# Patient Record
Sex: Male | Born: 2000 | Race: Black or African American | Hispanic: No | Marital: Single | Smoking: Former smoker
Health system: Southern US, Community
[De-identification: ages and names within clinical notes are randomized; demographics above are authoritative.]

---
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
## 2019-08-04 ENCOUNTER — Ambulatory Visit (INDEPENDENT_AMBULATORY_CARE_PROVIDER_SITE_OTHER): Payer: Medicaid Other

## 2019-08-04 ENCOUNTER — Other Ambulatory Visit: Payer: Self-pay

## 2019-08-04 ENCOUNTER — Encounter (HOSPITAL_COMMUNITY): Payer: Self-pay

## 2019-08-04 ENCOUNTER — Ambulatory Visit (HOSPITAL_COMMUNITY)
Admission: EM | Admit: 2019-08-04 | Discharge: 2019-08-04 | Disposition: A | Discharge status: Home / Self Care | Payer: Medicaid Other | Attending: Family Medicine | Admitting: Family Medicine

## 2019-08-04 DIAGNOSIS — S82842A Displaced bimalleolar fracture of left lower leg, initial encounter for closed fracture: Secondary | ICD-10-CM

## 2019-08-04 DIAGNOSIS — S92125A Nondisplaced fracture of body of left talus, initial encounter for closed fracture: Secondary | ICD-10-CM

## 2019-08-04 DIAGNOSIS — M25572 Pain in left ankle and joints of left foot: Secondary | ICD-10-CM | POA: Diagnosis not present

## 2019-08-04 DIAGNOSIS — M79672 Pain in left foot: Secondary | ICD-10-CM

## 2019-08-04 MED ORDER — HYDROCODONE-ACETAMINOPHEN 5-325 MG PO TABS: 1.0000 | ORAL_TABLET | Freq: Four times a day (QID) | ORAL | 0 refills | Status: DC | PRN

## 2019-08-04 NOTE — ED Provider Notes (Signed)
[facility]   [id number] 08/04/19 Arrival Time: 1231  ASSESSMENT & PLAN:  1. Acute left ankle pain   2. Acute foot pain, left   3. Closed bimalleolar fracture of left ankle, initial encounter   4. Closed nondisplaced fracture of body of left talus, initial encounter     I have personally viewed the imaging studies ordered this visit. Bimalleolar fractures. Apparent talus fracture. Discussed.   Meds ordered this encounter  Medications  . HYDROcodone-acetaminophen (NORCO/VICODIN) 5-325 MG tablet    Sig: Take 1 tablet by mouth every 6 (six) hours as needed for moderate pain or severe pain.    Dispense:  15 tablet    Refill:  0    Orders Placed This Encounter  Procedures  . DG Ankle Complete Left  . DG Foot Complete Left  . Apply splint short leg  . Crutches    Recommend:  Follow-up Information    Haddix, Gillie Manners, MD.   Specialty: Orthopedic Surgery Why: Dr Jena Gauss will see you Monday or Tuesday. Just give his office a call to set up an appointment. Contact information: [address] [place] [address] [phone]           Rest the injured area as much as practical. [place] Controlled Substances Registry consulted for this patient. I feel the risk/benefit ratio today is favorable for proceeding with this prescription for a controlled substance. Medication sedation precautions given.  Reviewed expectations re: course of current medical issues. Questions answered. Outlined signs and symptoms indicating need for more acute intervention. Patient verbalized understanding. After Visit Summary given.   SUBJECTIVE: History from: patient. Ronnie Munoz is a 19 y.o. male who reports persistent marked pain of his left ankle/foot; described as aching/throbbing; without radiation. Onset: abrupt. First noted: yesterday. Injury/trama: reports falling down a few steps and twisting ankle/foot; immediate pain; reports inability to bear weight after and  currently secondary to pain. Symptoms have progressed to a point and plateaued since beginning. Aggravating factors: certain movements. Alleviating factors: elevating LLE. Associated symptoms: none reported. Extremity sensation changes or weakness: none. Self treatment: has not tried OTC therapies. History of similar: no.   OBJECTIVE:  Vitals:   08/04/19 1328 08/04/19 1330  BP:  114/85  Pulse:  91  Resp:  16  Temp:  98.5 F (36.9 C)  TempSrc:  Oral  Weight: 72.6 kg     General appearance: alert; no distress HEENT: [place]; AT Neck: supple with FROM Resp: unlabored respirations Extremities: . LLE: warm with well perfused appearance; poorly localized moderate tenderness over his entire ankle; without gross deformities; swelling: significant; bruising: none; ankle ROM: limited by reported pain CV: brisk extremity capillary refill of LLE; 2+ DP pulse of LLE. Skin: warm and dry; no visible rashes Neurologic: normal sensation of LLE Psychological: alert and cooperative; normal mood and affect  Imaging: DG Ankle Complete Left  Result Date: 08/04/2019 CLINICAL DATA:  Left ankle pain and swelling after a fall down stairs last night. Initial encounter. EXAM: LEFT ANKLE COMPLETE - 3+ VIEW COMPARISON:  None. FINDINGS: The patient has a nondisplaced fracture of the medial malleolus. There is also a nondisplaced and incomplete appearing fracture through the distal diaphysis of the fibula. Imaged bones otherwise appear normal. There is soft tissue swelling about the ankle. Linear calcification projecting over the talonavicular joint on the lateral view is likely incidental. IMPRESSION: Acute nondisplaced medial and lateral malleolar fractures with associated swelling. Electronically Signed   By: Drusilla Kanner M.D.  On: 08/04/2019 14:04   DG Foot Complete Left  Result Date: 08/04/2019 CLINICAL DATA:  Left foot and ankle pain due to an injury suffered in a fall down steps last night. Initial  encounter. EXAM: LEFT FOOT - COMPLETE 3+ VIEW COMPARISON:  None. FINDINGS: A tiny bone fragment seen off the distal head of the talus compatible with an avulsion fracture. There is soft tissue swelling about the foot. IMPRESSION: Findings worrisome for a tiny avulsion fracture off the dorsal head of the talus. Electronically Signed   By: Inge Rise M.D.   On: 08/04/2019 14:06      No Known Allergies   Social History   Socioeconomic History  . Marital status: Single    Spouse name: Not on file  . Number of children: Not on file  . Years of education: Not on file  . Highest education level: Not on file  Occupational History  . Not on file  Tobacco Use  . Smoking status: Current Every Day Smoker    Packs/day: 1.00    Types: Cigars  . Smokeless tobacco: Never Used  Substance and Sexual Activity  . Alcohol use: Yes  . Drug use: No  . Sexual activity: Never  Other Topics Concern  . Not on file  Social History Narrative  . Not on file   Social Determinants of Health   Financial Resource Strain:   . Difficulty of Paying Living Expenses: Not on file  Food Insecurity:   . Worried About Charity fundraiser in the Last Year: Not on file  . Ran Out of Food in the Last Year: Not on file  Transportation Needs:   . Lack of Transportation (Medical): Not on file  . Lack of Transportation (Non-Medical): Not on file  Physical Activity:   . Days of Exercise per Week: Not on file  . Minutes of Exercise per Session: Not on file  Stress:   . Feeling of Stress : Not on file  Social Connections:   . Frequency of Communication with Friends and Family: Not on file  . Frequency of Social Gatherings with Friends and Family: Not on file  . Attends Religious Services: Not on file  . Active Member of Clubs or Organizations: Not on file  . Attends Archivist Meetings: Not on file  . Marital Status: Not on file   Family History  Family history unknown: Yes   History reviewed.  No pertinent surgical history.    Vanessa Kick, MD 08/07/19 [phone]

## 2019-08-04 NOTE — ED Triage Notes (Signed)
Pt states  He fell down some steps and injured his left ankle. This happened last night.

## 2019-08-04 NOTE — Progress Notes (Signed)
Orthopedic Tech Progress Note Patient Details:  Ronnie Munoz September 10, 2000 [id number]  Ortho Devices Type of Ortho Device: Crutches, Stirrup splint, Post (short leg) splint Ortho Device/Splint Interventions: Application, Ordered   Post Interventions Patient Tolerated: Ambulated well, Well Instructions Provided: Poper ambulation with device, Care of device, Adjustment of device   Donald Pore 08/04/2019, 3:38 PM

## 2019-08-04 NOTE — Discharge Instructions (Signed)
Do not bear any weight on your left ankle/foot. Do not remove your splint.   Be aware, pain medications may cause drowsiness. Please do not drive, operate heavy machinery or make important decisions while on this medication, it can cloud your judgement.

## 2019-08-04 NOTE — ED Notes (Signed)
Ortho Tech paged and on their way.

## 2021-08-15 ENCOUNTER — Encounter (HOSPITAL_COMMUNITY): Payer: Self-pay

## 2021-08-15 ENCOUNTER — Emergency Department (HOSPITAL_COMMUNITY): Payer: Medicaid Other

## 2021-08-15 ENCOUNTER — Emergency Department (HOSPITAL_COMMUNITY)
Admission: EM | Admit: 2021-08-15 | Discharge: 2021-08-15 | Disposition: A | Discharge status: Home / Self Care | Payer: Medicaid Other | Attending: Emergency Medicine | Admitting: Emergency Medicine

## 2021-08-15 ENCOUNTER — Other Ambulatory Visit: Payer: Self-pay

## 2021-08-15 DIAGNOSIS — S01112A Laceration without foreign body of left eyelid and periocular area, initial encounter: Secondary | ICD-10-CM | POA: Insufficient documentation

## 2021-08-15 DIAGNOSIS — S0992XA Unspecified injury of nose, initial encounter: Secondary | ICD-10-CM | POA: Diagnosis present

## 2021-08-15 DIAGNOSIS — S0181XA Laceration without foreign body of other part of head, initial encounter: Secondary | ICD-10-CM

## 2021-08-15 DIAGNOSIS — S022XXA Fracture of nasal bones, initial encounter for closed fracture: Secondary | ICD-10-CM | POA: Diagnosis not present

## 2021-08-15 MED ORDER — LIDOCAINE-EPINEPHRINE (PF) 2 %-1:200000 IJ SOLN
10.0000 mL | Freq: Once | INTRAMUSCULAR | Status: AC
  Administered 2021-08-15: 10 mL
  Filled 2021-08-15: qty 20
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
## 2022-10-01 ENCOUNTER — Ambulatory Visit (HOSPITAL_COMMUNITY): Payer: Medicaid Other
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
## 2024-01-23 ENCOUNTER — Ambulatory Visit (HOSPITAL_COMMUNITY)
Admission: EM | Admit: 2024-01-23 | Discharge: 2024-01-23 | Disposition: A | Discharge status: Home / Self Care | Attending: Emergency Medicine | Admitting: Emergency Medicine

## 2024-01-23 ENCOUNTER — Encounter (HOSPITAL_COMMUNITY): Payer: Self-pay

## 2024-01-23 DIAGNOSIS — H1013 Acute atopic conjunctivitis, bilateral: Secondary | ICD-10-CM

## 2024-01-23 DIAGNOSIS — J309 Allergic rhinitis, unspecified: Secondary | ICD-10-CM

## 2024-01-23 MED ORDER — OLOPATADINE HCL 0.1 % OP SOLN: 1.0000 [drp] | Freq: Two times a day (BID) | OPHTHALMIC | 0 refills | Status: AC

## 2024-01-23 MED ORDER — AZELASTINE HCL 0.1 % NA SOLN: 2.0000 | Freq: Two times a day (BID) | NASAL | 0 refills | Status: AC

## 2024-01-23 NOTE — ED Notes (Addendum)
 Patient c/o redness, eye drainage and crusty in the mornings x 1 week. Patient also c/o nasal congestion x 1 week.  Patient states he has been using Visine eye drops and Nyquil and Zyrtec.

## 2024-01-23 NOTE — ED Provider Notes (Signed)
 [facility]    CSN: [id number] Arrival date & time: 01/23/24  0813      History   Chief Complaint Chief Complaint  Patient presents with   eye issue   Nasal Congestion    HPI Ronnie Munoz is a 23 y.o. male.   Patient presents with bilateral eye redness, itching, and watery drainage x1 week. Patient reports that he also has some mild nasal congestion. Patient states that he has been using Visine eye drops throughout the day with minimal relief. Patient states that he has been taking Zyrtec on and off as well with minimal relief. Denies any known exposure to allergens.   The history is provided by the patient and medical records.    History reviewed. No pertinent past medical history.  There are no active problems to display for this patient.   History reviewed. No pertinent surgical history.     Home Medications    Prior to Admission medications   Medication Sig Start Date End Date Taking? Authorizing Provider  azelastine (ASTELIN) 0.1 % nasal spray Place 2 sprays into both nostrils 2 (two) times daily. Use in each nostril as directed 01/23/24  Yes Johnie, Anique Beckley A, NP  olopatadine (PATADAY) 0.1 % ophthalmic solution Place 1 drop into both eyes 2 (two) times daily. 01/23/24  Yes Johnie Rumaldo LABOR, NP    Family History Family History  Family history unknown: Yes    Social History Social History   Tobacco Use   Smoking status: Former    Current packs/day: 1.00    Types: Cigars, Cigarettes   Smokeless tobacco: Never  Vaping Use   Vaping status: Never Used  Substance Use Topics   Alcohol use: Yes   Drug use: No     Allergies   Patient has no known allergies.   Review of Systems Review of Systems  Per HPI  Physical Exam Triage Vital Signs ED Triage Vitals [01/23/24 0837]  Encounter Vitals Group     BP 115/79     Girls Systolic BP Percentile      Girls Diastolic BP Percentile      Boys Systolic BP Percentile      Boys Diastolic  BP Percentile      Pulse Rate 60     Resp 14     Temp 98.1 F (36.7 C)     Temp Source Oral     SpO2 98 %     Weight      Height      Head Circumference      Peak Flow      Pain Score 3     Pain Loc      Pain Education      Exclude from Growth Chart    No data found.  Updated Vital Signs BP 115/79 (BP Location: Right Arm)   Pulse 60   Temp 98.1 F (36.7 C) (Oral)   Resp 14   SpO2 98%   Visual Acuity Right Eye Distance:   Left Eye Distance:   Bilateral Distance:    Right Eye Near:   Left Eye Near:    Bilateral Near:     Physical Exam Vitals and nursing note reviewed.  Constitutional:      General: He is awake. He is not in acute distress.    Appearance: Normal appearance. He is well-developed and well-groomed. He is not ill-appearing.  HENT:     Right Ear: Tympanic membrane, ear canal and external ear normal.  Left Ear: Tympanic membrane, ear canal and external ear normal.     Nose: Congestion and rhinorrhea present.     Mouth/Throat:     Mouth: Mucous membranes are moist.     Pharynx: Oropharynx is clear.  Eyes:     General:        Right eye: Discharge present.        Left eye: Discharge present.    Extraocular Movements: Extraocular movements intact.     Conjunctiva/sclera:     Right eye: Right conjunctiva is injected.     Left eye: Left conjunctiva is injected.     Pupils: Pupils are equal, round, and reactive to light.     Comments: Mild injection noted to bilateral conjunctivae with watery discharge present.   Cardiovascular:     Rate and Rhythm: Normal rate and regular rhythm.  Pulmonary:     Effort: Pulmonary effort is normal.     Breath sounds: Normal breath sounds.  Skin:    General: Skin is warm and dry.  Neurological:     Mental Status: He is alert.  Psychiatric:        Behavior: Behavior is cooperative.      UC Treatments / Results  Labs (all labs ordered are listed, but only abnormal results are displayed) Labs Reviewed - No  data to display  EKG   Radiology No results found.  Procedures Procedures (including critical care time)  Medications Ordered in UC Medications - No data to display  Initial Impression / Assessment and Plan / UC Course  I have reviewed the triage vital signs and the nursing notes.  Pertinent labs & imaging results that were available during my care of the patient were reviewed by me and considered in my medical decision making (see chart for details).     Patient is well-appearing. Vitals stable. Exam findings consistent with allergic rhinitis and conjunctivitis. Prescribed Pataday eyedrops and azelastine nasal spray. Recommended continuing with Zyrtec daily. Discussed follow-up and return precautions.  Final Clinical Impressions(s) / UC Diagnoses   Final diagnoses:  Allergic rhinitis, unspecified seasonality, unspecified trigger  Allergic conjunctivitis of both eyes     Discharge Instructions      Use Pataday eyedrops twice daily for eye redness and itching.  Use azelastine nasal spray twice daily for congestion relief.  Continue taking Zyrtec daily to help with this. Follow-up with primary care provider or return here as needed.    ED Prescriptions     Medication Sig Dispense Auth. Provider   azelastine (ASTELIN) 0.1 % nasal spray Place 2 sprays into both nostrils 2 (two) times daily. Use in each nostril as directed 30 mL Johnie Flaming A, NP   olopatadine (PATADAY) 0.1 % ophthalmic solution Place 1 drop into both eyes 2 (two) times daily. 5 mL Johnie Flaming A, NP      PDMP not reviewed this encounter.   Johnie Flaming A, NP 01/23/24 [phone]

## 2024-01-23 NOTE — Discharge Instructions (Signed)
 Use Pataday eyedrops twice daily for eye redness and itching.  Use azelastine nasal spray twice daily for congestion relief.  Continue taking Zyrtec daily to help with this. Follow-up with primary care provider or return here as needed.
